# Patient Record
Sex: Female | Born: 1960 | Race: White | Hispanic: No
Health system: Southern US, Community
[De-identification: ages and names within clinical notes are randomized; demographics above are authoritative.]

## PROBLEM LIST (undated history)

## (undated) HISTORY — PX: GALLBLADDER SURGERY: SHX652

---

## 2009-03-10 ENCOUNTER — Emergency Department (HOSPITAL_COMMUNITY): Admission: EM | Admit: 2009-03-10 | Discharge: 2009-03-10 | Payer: Self-pay | Admitting: Emergency Medicine

## 2010-11-15 LAB — URINALYSIS, ROUTINE W REFLEX MICROSCOPIC
Glucose, UA: NEGATIVE mg/dL
Nitrite: NEGATIVE
Specific Gravity, Urine: 1.008 (ref 1.005–1.030)
pH: 7.5 (ref 5.0–8.0)

## 2010-11-15 LAB — POCT CARDIAC MARKERS
CKMB, poc: <1 ng/mL — ABNORMAL LOW (ref 1.0–8.0)
Myoglobin, poc: 57.7 ng/mL (ref 12–200)

## 2010-11-15 LAB — DIFFERENTIAL
Basophils Absolute: 0 10*3/uL (ref 0.0–0.1)
Lymphocytes Relative: 25 % (ref 12–46)
Lymphs Abs: 1.7 10*3/uL (ref 0.7–4.0)
Neutro Abs: 4.4 10*3/uL (ref 1.7–7.7)

## 2010-11-15 LAB — CBC
HCT: 39.4 % (ref 36.0–46.0)
Hemoglobin: 13.8 g/dL (ref 12.0–15.0)
MCHC: 34.9 g/dL (ref 30.0–36.0)
MCV: 90.3 fL (ref 78.0–100.0)
Platelets: 263 10*3/uL (ref 150–400)
RBC: 4.37 MIL/uL (ref 3.87–5.11)
RDW: 13.3 % (ref 11.5–15.5)
WBC: 6.9 10*3/uL (ref 4.0–10.5)

## 2010-11-15 LAB — COMPREHENSIVE METABOLIC PANEL
BUN: 18 mg/dL (ref 6–23)
CO2: 23 mEq/L (ref 19–32)
Calcium: 9 mg/dL (ref 8.4–10.5)
Chloride: 106 mEq/L (ref 96–112)
Creatinine, Ser: 0.88 mg/dL (ref 0.4–1.2)
GFR calc Af Amer: >60 mL/min (ref >60)
GFR calc non Af Amer: >60 mL/min (ref >60)
Total Bilirubin: 1.2 mg/dL (ref 0.3–1.2)

## 2010-11-15 LAB — URINE MICROSCOPIC-ADD ON

## 2010-11-15 LAB — URINE CULTURE: Colony Count: 70000

## 2011-08-23 ENCOUNTER — Emergency Department (HOSPITAL_COMMUNITY)
Admission: EM | Admit: 2011-08-23 | Discharge: 2011-08-23 | Disposition: A | Discharge status: Home / Self Care | Payer: 59 | Attending: Emergency Medicine | Admitting: Emergency Medicine

## 2011-08-23 ENCOUNTER — Encounter (HOSPITAL_COMMUNITY): Payer: Self-pay | Admitting: Emergency Medicine

## 2011-08-23 ENCOUNTER — Emergency Department (HOSPITAL_COMMUNITY): Payer: 59

## 2011-08-23 DIAGNOSIS — M549 Dorsalgia, unspecified: Secondary | ICD-10-CM | POA: Insufficient documentation

## 2011-08-23 DIAGNOSIS — R079 Chest pain, unspecified: Secondary | ICD-10-CM | POA: Insufficient documentation

## 2011-08-23 DIAGNOSIS — Y92410 Unspecified street and highway as the place of occurrence of the external cause: Secondary | ICD-10-CM | POA: Insufficient documentation

## 2011-08-23 DIAGNOSIS — IMO0001 Reserved for inherently not codable concepts without codable children: Secondary | ICD-10-CM | POA: Insufficient documentation

## 2011-08-23 DIAGNOSIS — S301XXA Contusion of abdominal wall, initial encounter: Secondary | ICD-10-CM | POA: Insufficient documentation

## 2011-08-23 DIAGNOSIS — M25519 Pain in unspecified shoulder: Secondary | ICD-10-CM | POA: Insufficient documentation

## 2011-08-23 DIAGNOSIS — M542 Cervicalgia: Secondary | ICD-10-CM | POA: Insufficient documentation

## 2011-08-23 DIAGNOSIS — S5010XA Contusion of unspecified forearm, initial encounter: Secondary | ICD-10-CM | POA: Insufficient documentation

## 2011-08-23 MED ORDER — DIAZEPAM 5 MG PO TABS
5.0000 mg | ORAL_TABLET | Freq: Once | ORAL | Status: AC
  Administered 2011-08-23: 5 mg via ORAL
  Filled 2011-08-23: qty 1

## 2011-08-23 MED ORDER — KETOROLAC TROMETHAMINE 30 MG/ML IJ SOLN
30.0000 mg | Freq: Once | INTRAMUSCULAR | Status: AC
  Administered 2011-08-23: 30 mg via INTRAMUSCULAR
  Filled 2011-08-23: qty 1

## 2011-08-23 MED ORDER — DIAZEPAM 5 MG PO TABS: 5.0000 mg | ORAL_TABLET | Freq: Two times a day (BID) | ORAL | Status: AC

## 2011-08-23 MED ORDER — IBUPROFEN 800 MG PO TABS: 800.0000 mg | ORAL_TABLET | Freq: Three times a day (TID) | ORAL | Status: AC

## 2011-08-23 NOTE — ED Notes (Signed)
Patient transported to X-ray 
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
## 2011-08-23 NOTE — ED Notes (Signed)
Ice pack placed to abrasions on left forearm

## 2011-08-23 NOTE — ED Notes (Signed)
Pt removed from backboard by p.a.  c collar left in place.

## 2011-08-25 NOTE — ED Provider Notes (Signed)
Medical screening examination/treatment/procedure(s) were performed by non-physician practitioner and as supervising physician I was immediately available for consultation/collaboration.  Haneef Hallquist T Bo Teicher, MD 08/25/11 1657 
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
## 2018-05-22 ENCOUNTER — Encounter (HOSPITAL_COMMUNITY): Payer: Self-pay

## 2018-05-22 ENCOUNTER — Ambulatory Visit (HOSPITAL_COMMUNITY)
Admission: EM | Admit: 2018-05-22 | Discharge: 2018-05-22 | Disposition: A | Discharge status: Home / Self Care | Payer: Managed Care, Other (non HMO) | Attending: Family Medicine | Admitting: Family Medicine

## 2018-05-22 DIAGNOSIS — J069 Acute upper respiratory infection, unspecified: Secondary | ICD-10-CM

## 2018-05-22 DIAGNOSIS — B9789 Other viral agents as the cause of diseases classified elsewhere: Secondary | ICD-10-CM | POA: Diagnosis not present

## 2018-05-22 DIAGNOSIS — J029 Acute pharyngitis, unspecified: Secondary | ICD-10-CM | POA: Insufficient documentation

## 2018-05-22 DIAGNOSIS — R05 Cough: Secondary | ICD-10-CM | POA: Diagnosis not present

## 2018-05-22 LAB — POCT RAPID STREP A: Streptococcus, Group A Screen (Direct): NEGATIVE

## 2018-05-22 MED ORDER — AZITHROMYCIN 250 MG PO TABS: 250.0000 mg | ORAL_TABLET | Freq: Every day | ORAL | 0 refills | Status: AC

## 2018-05-22 MED ORDER — BENZONATATE 100 MG PO CAPS: 100.0000 mg | ORAL_CAPSULE | Freq: Three times a day (TID) | ORAL | 0 refills | Status: AC | PRN

## 2018-05-22 NOTE — Discharge Instructions (Addendum)
If symptoms don't improve in 1-2 days, please return for reevaluation.

## 2018-05-22 NOTE — ED Provider Notes (Addendum)
[facility]    CSN: [id number] Arrival date & time: 05/22/18  4098     History   Chief Complaint Chief Complaint  Patient presents with  . Sore Throat  . Cough    HPI Alexandra Cordova is a 57 y.o. female.   Pt presents with persistent cough and sore throat.  She has had symptoms for 7 days and has run a low grade temp.  She just had a week of vacation where she was sick the whole time at [facility].  Nonsmoker.  Customer service at [employer].     History reviewed. No pertinent past medical history.  There are no active problems to display for this patient.   Past Surgical History:  Procedure Laterality Date  . GALLBLADDER SURGERY      OB History   None      Home Medications    Prior to Admission medications   Medication Sig Start Date End Date Taking? Authorizing Provider  azithromycin (ZITHROMAX) 250 MG tablet Take 1 tablet (250 mg total) by mouth daily. Take first 2 tablets together, then 1 every day until finished. 05/22/18   Elvina Sidle, MD  benzonatate (TESSALON) 100 MG capsule Take 1-2 capsules (100-200 mg total) by mouth 3 (three) times daily as needed for cough. 05/22/18   Elvina Sidle, MD  ibuprofen (ADVIL,MOTRIN) 200 MG tablet Take 400 mg by mouth every 6 (six) hours as needed. pain    [provider]    Family History History reviewed. No pertinent family history.  Social History Social History   Tobacco Use  . Smoking status: Not on file  Substance Use Topics  . Alcohol use: Not on file  . Drug use: Not on file     Allergies   Dilantin [phenytoin sodium extended] and Penicillins   Review of Systems Review of Systems  Constitutional: Positive for fatigue.  HENT: Positive for sore throat.   Respiratory: Positive for cough.   All other systems reviewed and are negative.    Physical Exam Triage Vital Signs ED Triage Vitals  Enc Vitals Group     BP 05/22/18 0838 139/84     Pulse Rate  05/22/18 0838 70     Resp 05/22/18 0838 16     Temp 05/22/18 0838 98 F (36.7 C)     Temp Source 05/22/18 0838 Oral     SpO2 05/22/18 0838 98 %     Weight --      Height --      Head Circumference --      Peak Flow --      Pain Score 05/22/18 0849 7     Pain Loc --      Pain Edu? --      Excl. in GC? --    No data found.  Updated Vital Signs BP 139/84 (BP Location: Left Arm)   Pulse 70   Temp 98 F (36.7 C) (Oral)   Resp 16   LMP  (LMP Unknown)   SpO2 98%    Physical Exam  Constitutional: She appears well-developed and well-nourished.  HENT:  Head: Normocephalic.  Right Ear: Hearing, tympanic membrane and ear canal normal.  Left Ear: Hearing, tympanic membrane and ear canal normal.  Mouth/Throat: Mucous membranes are normal. Posterior oropharyngeal erythema present.  Eyes: Pupils are equal, round, and reactive to light.  Neck: Normal range of motion.  Cardiovascular: Normal rate.  Pulmonary/Chest: Effort normal.  Abdominal: Soft. Bowel sounds are normal.  Neurological:  She is alert.  Skin: Skin is warm and dry.  Psychiatric: She has a normal mood and affect.  Nursing note and vitals reviewed.    UC Treatments / Results  Labs (all labs ordered are listed, but only abnormal results are displayed) Labs Reviewed  POCT RAPID STREP A    EKG None  Radiology No results found.  Procedures Procedures (including critical care time)  Medications Ordered in UC Medications - No data to display  Initial Impression / Assessment and Plan / UC Course  I have reviewed the triage vital signs and the nursing notes.  Pertinent labs & imaging results that were available during my care of the patient were reviewed by me and considered in my medical decision making (see chart for details).    Final Clinical Impressions(s) / UC Diagnoses   Final diagnoses:  Viral URI with cough     Discharge Instructions     If symptoms don't improve in 1-2 days, please return  for reevaluation.    ED Prescriptions    Medication Sig Dispense Auth. Provider   azithromycin (ZITHROMAX) 250 MG tablet Take 1 tablet (250 mg total) by mouth daily. Take first 2 tablets together, then 1 every day until finished. 6 tablet Elvina Sidle, MD   benzonatate (TESSALON) 100 MG capsule Take 1-2 capsules (100-200 mg total) by mouth 3 (three) times daily as needed for cough. 40 capsule Elvina Sidle, MD     Controlled Substance Prescriptions [place] Controlled Substance Registry consulted? Not Applicable   Elvina Sidle, MD 05/22/18 1610    Elvina Sidle, MD 05/22/18 [phone]

## 2018-05-22 NOTE — ED Triage Notes (Signed)
Pt presents with persistent cough and sore throat. 

## 2018-05-24 LAB — CULTURE, GROUP A STREP (THRC)
